# Patient Record
Sex: Male | Born: 1990 | Race: Black or African American | Hispanic: No | Marital: Single
Health system: Southern US, Community
[De-identification: ages and names within clinical notes are randomized; demographics above are authoritative.]

---
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
## 2011-09-17 ENCOUNTER — Encounter (HOSPITAL_COMMUNITY): Payer: Self-pay | Admitting: *Deleted

## 2011-09-17 ENCOUNTER — Emergency Department (HOSPITAL_COMMUNITY)
Admission: EM | Admit: 2011-09-17 | Discharge: 2011-09-17 | Disposition: A | Discharge status: Home / Self Care | Payer: Self-pay | Attending: Emergency Medicine | Admitting: Emergency Medicine

## 2011-09-17 DIAGNOSIS — K922 Gastrointestinal hemorrhage, unspecified: Secondary | ICD-10-CM | POA: Insufficient documentation

## 2011-09-17 DIAGNOSIS — K921 Melena: Secondary | ICD-10-CM | POA: Insufficient documentation

## 2011-09-17 DIAGNOSIS — R1011 Right upper quadrant pain: Secondary | ICD-10-CM | POA: Insufficient documentation

## 2011-09-17 LAB — OCCULT BLOOD, POC DEVICE: Fecal Occult Bld: POSITIVE

## 2011-09-17 MED ORDER — OMEPRAZOLE 20 MG PO CPDR: DELAYED_RELEASE_CAPSULE | ORAL | Status: DC

## 2011-09-17 MED ORDER — SUCRALFATE 1 G PO TABS: ORAL_TABLET | ORAL | Status: DC

## 2011-09-17 NOTE — ED Notes (Signed)
Pt in CDU 3 with mother

## 2011-09-17 NOTE — ED Notes (Signed)
Pt is here with rectal bleeding with bowel movement and has had some lower abd cramping.  No hx of hemorrhoids

## 2011-09-17 NOTE — ED Provider Notes (Signed)
History     CSN: [id number]  Arrival date & time 09/17/11  1048   First MD Initiated Contact with Patient 09/17/11 1308      Chief Complaint  Patient presents with  . Rectal Bleeding    (Consider location/radiation/quality/duration/timing/severity/associated sxs/prior treatment) HPI Comments: Patient with history of several bowel movements with blood over the past 2 weeks. Patient had an episode this morning when he noticed blood on the toilet paper and on the stool. Patient states the blood was dark red. He also describes having right upper quadrant pain for approximately 30 minutes after having a bowel movement. This is resolved. Patient denies nausea or vomiting. He denies diarrhea. Patient does not have any history of colitis or other GI illness.  Patient is a 21 y.o. male presenting with hematochezia. The history is provided by the patient and a relative.  Rectal Bleeding  The current episode started more than 1 week ago. The stool is described as streaked with blood. Associated symptoms include abdominal pain. Pertinent negatives include no fever, no diarrhea, no nausea, no vomiting, no chest pain, no headaches, no coughing and no rash.    History reviewed. No pertinent past medical history.  History reviewed. No pertinent past surgical history.  No family history on file.  History  Substance Use Topics  . Smoking status: Passive Smoker  . Smokeless tobacco: Not on file  . Alcohol Use: Yes     occ      Review of Systems  Constitutional: Negative for fever.  HENT: Negative for sore throat and rhinorrhea.   Eyes: Negative for redness.  Respiratory: Negative for cough.   Cardiovascular: Negative for chest pain.  Gastrointestinal: Positive for abdominal pain, blood in stool and hematochezia. Negative for nausea, vomiting and diarrhea.  Genitourinary: Negative for dysuria.  Musculoskeletal: Negative for myalgias.  Skin: Negative for rash.  Neurological: Negative for  headaches.    Allergies  Review of patient's allergies indicates no known allergies.  Home Medications  No current outpatient prescriptions on file.  BP 106/65  Pulse 72  Temp(Src) 97.7 F (36.5 C) (Oral)  Resp 14  SpO2 98%  Physical Exam  Nursing note and vitals reviewed. Constitutional: He is oriented to person, place, and time. He appears well-developed and well-nourished.  HENT:  Head: Normocephalic and atraumatic.  Eyes: Conjunctivae are normal. Right eye exhibits no discharge. Left eye exhibits no discharge.  Neck: Normal range of motion. Neck supple.  Cardiovascular: Normal rate, regular rhythm and normal heart sounds.   Pulmonary/Chest: Effort normal and breath sounds normal.  Abdominal: Soft. Bowel sounds are normal. There is no tenderness.  Genitourinary: Rectal exam shows no external hemorrhoid, no internal hemorrhoid, no mass, no tenderness and anal tone normal. Guaiac positive stool.  Neurological: He is alert and oriented to person, place, and time.  Skin: Skin is warm and dry.  Psychiatric: He has a normal mood and affect.    ED Course  Procedures (including critical care time)  Labs Reviewed - No data to display No results found.   1. GI bleed     1:30 PM Patient seen and examined.    Vital signs reviewed and are as follows: Filed Vitals:   09/17/11 1124  BP: 106/65  Pulse: 72  Temp: 97.7 F (36.5 C)  Resp: 14    Will move to CDU for rectal exam.  Rectal exam was performed. Guaiac +. Pt was d/w Dr. Radford Pax. Will d/c to home with GI follow-up. Will start on  trial of PPI and carafate. Patient and mother urged to return with worsening pain, worsening bleeding, persistent vomiting, other concerns. Patient and mother verbalize understanding and agree with plan.  MDM  Patient with confirmed blood in stools. Bleeding is minimal. Rectal is otherwise normal. No obvious external or internal hemorrhoids. Patient does not appear anemic and appears well.  Do not suspect colitis. Patient is stable and bleeding is not life threatening -- GI follow-up will be needed and GI referral given. Will give empiric trial of prilosec in case component of PUD given pain.         Eustace Moore [place], [place] 09/17/11 2110

## 2011-09-18 NOTE — ED Provider Notes (Signed)
Medical screening examination/treatment/procedure(s) were performed by non-physician practitioner and as supervising physician I was immediately available for consultation/collaboration.    Nelia Shi, MD 09/18/11 2041

## 2013-03-14 ENCOUNTER — Emergency Department (HOSPITAL_COMMUNITY)
Admission: EM | Admit: 2013-03-14 | Discharge: 2013-03-14 | Disposition: A | Discharge status: Home / Self Care | Payer: Self-pay | Attending: Emergency Medicine | Admitting: Emergency Medicine

## 2013-03-14 DIAGNOSIS — Z79899 Other long term (current) drug therapy: Secondary | ICD-10-CM | POA: Insufficient documentation

## 2013-03-14 DIAGNOSIS — K0889 Other specified disorders of teeth and supporting structures: Secondary | ICD-10-CM

## 2013-03-14 DIAGNOSIS — K089 Disorder of teeth and supporting structures, unspecified: Secondary | ICD-10-CM | POA: Insufficient documentation

## 2013-03-14 MED ORDER — AMOXICILLIN 500 MG PO CAPS: 500.0000 mg | ORAL_CAPSULE | Freq: Three times a day (TID) | ORAL | Status: DC

## 2013-03-14 MED ORDER — OXYCODONE-ACETAMINOPHEN 5-325 MG PO TABS
2.0000 | ORAL_TABLET | Freq: Once | ORAL | Status: AC
  Administered 2013-03-14: 2 via ORAL
  Filled 2013-03-14: qty 2

## 2013-03-14 MED ORDER — OXYCODONE-ACETAMINOPHEN 5-325 MG PO TABS: 1.0000 | ORAL_TABLET | ORAL | Status: DC | PRN

## 2013-03-14 NOTE — ED Provider Notes (Signed)
CSN: [id number]     Arrival date & time 03/14/13  1906 History  This chart was scribed for non-physician practitioner Arthor Captain, PA-C, working with Gilda Crease, by Yevette Edwards, ED Scribe. This patient was seen in room TR08C/TR08C and the patient's care was started at 7:31 PM.   First MD Initiated Contact with Patient 03/14/13 1921     Chief Complaint  Patient presents with  . Dental Pain    HPI HPI Comments: Ronnie Edwards is a 22 y.o. male who presents to the Emergency Department complaining of dental pain to the upper right rear. He describes the pain as "throbbing" and a 9/10.  The pain radiates to his jaw. He states that cold air, talking, and eating increase the pain. The pt denies experiencing any pain with swallowing or any drainage from the site. The pt states he has treated the pain with IBU, but with little resolution.He does not have a dentist.  No past medical history on file. No past surgical history on file. No family history on file. History  Substance Use Topics  . Smoking status: Passive Smoke Exposure - Never Smoker  . Smokeless tobacco: Not on file  . Alcohol Use: Yes     Comment: occ    Review of Systems  Constitutional: Positive for appetite change.  HENT: Positive for dental problem. Negative for trouble swallowing.   Respiratory: Negative for shortness of breath.     Allergies  Review of patient's allergies indicates no known allergies.  Home Medications   Current Outpatient Rx  Name  Route  Sig  Dispense  Refill  . omeprazole (PRILOSEC) 20 MG capsule      Take one tab PO bid x 3 days then once a day   30 capsule   0   . sucralfate (CARAFATE) 1 G tablet      Take with meals and at bedtime   30 tablet   0    There were no vitals taken for this visit. Physical Exam  Nursing note and vitals reviewed. Constitutional: He is oriented to person, place, and time. He appears well-developed and well-nourished. No distress.  HENT:   Head: Normocephalic and atraumatic.  Fracture on back of last molar with a carry in teh center. Some erythema in tissue. No induration, no area of fluctuance, no edema.  Uvula midline. Airway patent. Tender to palpation of masseter. Some tonsillar lymphadenopathy on the right side.   Eyes: EOM are normal.  Neck: Neck supple. No tracheal deviation present.  Cardiovascular: Normal rate.   Pulmonary/Chest: Effort normal. No respiratory distress.  Musculoskeletal: Normal range of motion.  Neurological: He is alert and oriented to person, place, and time.  Skin: Skin is warm and dry.  Psychiatric: He has a normal mood and affect. His behavior is normal.    ED Course    COORDINATION OF CARE:  7:36 PM- Discussed treatment plan with patient, and the patient agreed to the plan.   Procedures (including critical care time)  Labs Reviewed - No data to display No results found. 1. Pain, dental     MDM  Patient with toothache.  No gross abscess.  Exam unconcerning for Ludwig's angina or spread of infection.  Will treat with penicillin and pain medicine.  Urged patient to follow-up with dentist.    I personally performed the services described in this documentation, which was scribed in my presence. The recorded information has been reviewed and is accurate.     Cammy Copa  Tiburcio Pea, PA-C 03/15/13 1934

## 2013-03-14 NOTE — ED Notes (Signed)
Pt c/o dental pain upper right rear x 2 months

## 2013-03-16 NOTE — ED Provider Notes (Signed)
Medical screening examination/treatment/procedure(s) were performed by non-physician practitioner and as supervising physician I was immediately available for consultation/collaboration.    Errika Narvaiz J. Rayaan Lorah, MD 03/16/13 0722 

## 2013-04-03 ENCOUNTER — Emergency Department (HOSPITAL_COMMUNITY): Payer: Self-pay

## 2013-04-03 ENCOUNTER — Encounter (HOSPITAL_COMMUNITY): Payer: Self-pay | Admitting: Emergency Medicine

## 2013-04-03 ENCOUNTER — Emergency Department (HOSPITAL_COMMUNITY)
Admission: EM | Admit: 2013-04-03 | Discharge: 2013-04-03 | Disposition: A | Discharge status: Home / Self Care | Payer: Self-pay | Attending: Emergency Medicine | Admitting: Emergency Medicine

## 2013-04-03 DIAGNOSIS — S0003XA Contusion of scalp, initial encounter: Secondary | ICD-10-CM | POA: Insufficient documentation

## 2013-04-03 DIAGNOSIS — S0083XA Contusion of other part of head, initial encounter: Secondary | ICD-10-CM

## 2013-04-03 DIAGNOSIS — K029 Dental caries, unspecified: Secondary | ICD-10-CM | POA: Insufficient documentation

## 2013-04-03 MED ORDER — NAPROXEN 500 MG PO TABS: 500.0000 mg | ORAL_TABLET | Freq: Two times a day (BID) | ORAL | Status: DC
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
## 2013-04-03 NOTE — ED Provider Notes (Signed)
Medical screening examination/treatment/procedure(s) were performed by non-physician practitioner and as supervising physician I was immediately available for consultation/collaboration.   William Arianna Haydon, MD 04/03/13 1554 

## 2013-04-03 NOTE — ED Notes (Signed)
Patient transported to CT 
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
## 2013-04-03 NOTE — Progress Notes (Signed)
Met Patient at bedside.Role of case manager explained.Patient verbalizes his understanding.Patient reports he does not have have a PCP. Education provided to patient on the [facility] on [employer].Patient provided with resource sheet and receptive to information.

## 2013-04-09 ENCOUNTER — Telehealth: Payer: Self-pay | Admitting: General Practice

## 2013-05-03 NOTE — Telephone Encounter (Signed)
Pt did not return call to schedule appt

## 2014-03-15 ENCOUNTER — Encounter (HOSPITAL_COMMUNITY): Payer: Self-pay | Admitting: Emergency Medicine

## 2014-03-15 ENCOUNTER — Emergency Department (HOSPITAL_COMMUNITY)
Admission: EM | Admit: 2014-03-15 | Discharge: 2014-03-15 | Disposition: A | Discharge status: Home / Self Care | Payer: Self-pay | Attending: Emergency Medicine | Admitting: Emergency Medicine

## 2014-03-15 DIAGNOSIS — Z79899 Other long term (current) drug therapy: Secondary | ICD-10-CM | POA: Insufficient documentation

## 2014-03-15 DIAGNOSIS — Z791 Long term (current) use of non-steroidal anti-inflammatories (NSAID): Secondary | ICD-10-CM | POA: Insufficient documentation

## 2014-03-15 DIAGNOSIS — K089 Disorder of teeth and supporting structures, unspecified: Secondary | ICD-10-CM | POA: Insufficient documentation

## 2014-03-15 DIAGNOSIS — K029 Dental caries, unspecified: Secondary | ICD-10-CM | POA: Insufficient documentation

## 2014-03-15 MED ORDER — TRAMADOL HCL 50 MG PO TABS: 50.0000 mg | ORAL_TABLET | Freq: Four times a day (QID) | ORAL | Status: DC

## 2014-03-15 MED ORDER — TRAMADOL HCL 50 MG PO TABS
50.0000 mg | ORAL_TABLET | Freq: Once | ORAL | Status: AC
  Administered 2014-03-15: 50 mg via ORAL
  Filled 2014-03-15: qty 1

## 2014-03-15 NOTE — ED Provider Notes (Signed)
Medical screening examination/treatment/procedure(s) were performed by non-physician practitioner and as supervising physician I was immediately available for consultation/collaboration.    Collie Wernick, MD 03/15/14 0759 

## 2014-03-15 NOTE — ED Provider Notes (Signed)
CSN: [id number]     Arrival date & time 03/15/14  0120 History   First MD Initiated Contact with Patient 03/15/14 [phone]     Chief Complaint  Patient presents with  . Dental Pain     (Consider location/radiation/quality/duration/timing/severity/associated sxs/prior Treatment) Patient is a 23 y.o. male presenting with tooth pain. The history is provided by the patient.  Dental Pain Location:  Upper Upper teeth location:  16/LU 3rd molar Quality:  Dull Onset quality:  Gradual Timing:  Constant Progression:  Worsening Chronicity:  Chronic Context: dental caries   Context: not abscess   Relieved by:  Nothing Worsened by:  Cold food/drink Ineffective treatments:  NSAIDs Associated symptoms: no facial pain, no facial swelling, no fever and no headaches     History reviewed. No pertinent past medical history. History reviewed. No pertinent past surgical history. No family history on file. History  Substance Use Topics  . Smoking status: Passive Smoke Exposure - Never Smoker  . Smokeless tobacco: Not on file  . Alcohol Use: Yes     Comment: occ    Review of Systems  Constitutional: Negative for fever.  HENT: Positive for dental problem. Negative for facial swelling.   Neurological: Negative for dizziness and headaches.  All other systems reviewed and are negative.     Allergies  Coconut flavor  Home Medications   Prior to Admission medications   Medication Sig Start Date End Date Taking? Authorizing Provider  ibuprofen (ADVIL,MOTRIN) 800 MG tablet Take 800 mg by mouth every 8 (eight) hours as needed for moderate pain.   Yes Historical Provider, MD  traMADol (ULTRAM) 50 MG tablet Take 1 tablet (50 mg total) by mouth 4 (four) times daily. 03/15/14   Arman FilterGail K Denym Christenberry, NP   BP 124/66  Pulse 91  Temp(Src) 98.4 F (36.9 C) (Oral)  Resp 14  Ht 6' (1.829 m)  Wt 200 lb (90.719 kg)  BMI 27.12 kg/m2  SpO2 100% Physical Exam  Nursing note and vitals reviewed. Constitutional:  He appears well-developed and well-nourished.  HENT:  Head: Normocephalic.  Right Ear: External ear normal.  Left Ear: External ear normal.  Mouth/Throat:    Eyes: Pupils are equal, round, and reactive to light.  Neck: Normal range of motion.  Cardiovascular: Normal rate.   Pulmonary/Chest: Effort normal.  Musculoskeletal: Normal range of motion.  Lymphadenopathy:    He has no cervical adenopathy.  Skin: Skin is warm.    ED Course  Procedures (including critical care time) Labs Review Labs Reviewed - No data to display  Imaging Review No results found.   EKG Interpretation None      MDM   Final diagnoses:  Dental caries         Arman FilterGail K Altair Stanko, NP 03/15/14 [phone]

## 2014-03-15 NOTE — Discharge Instructions (Signed)
You have been referred to a dentist call first thing in the morning telling the receptionist that you have been referred through the ED

## 2014-03-15 NOTE — ED Notes (Signed)
Pt. reports chronic left upper molar pain for several months worse these past several days unrelieved by OTC pain medications .

## 2014-04-15 ENCOUNTER — Encounter (HOSPITAL_COMMUNITY): Payer: Self-pay | Admitting: Emergency Medicine

## 2014-04-15 ENCOUNTER — Emergency Department (HOSPITAL_COMMUNITY)
Admission: EM | Admit: 2014-04-15 | Discharge: 2014-04-15 | Disposition: A | Discharge status: Home / Self Care | Payer: Self-pay | Attending: Emergency Medicine | Admitting: Emergency Medicine

## 2014-04-15 DIAGNOSIS — K0889 Other specified disorders of teeth and supporting structures: Secondary | ICD-10-CM

## 2014-04-15 DIAGNOSIS — Z79899 Other long term (current) drug therapy: Secondary | ICD-10-CM | POA: Insufficient documentation

## 2014-04-15 DIAGNOSIS — K089 Disorder of teeth and supporting structures, unspecified: Secondary | ICD-10-CM | POA: Insufficient documentation

## 2014-04-15 DIAGNOSIS — K029 Dental caries, unspecified: Secondary | ICD-10-CM | POA: Insufficient documentation

## 2014-04-15 DIAGNOSIS — R51 Headache: Secondary | ICD-10-CM | POA: Insufficient documentation

## 2014-04-15 MED ORDER — PENICILLIN V POTASSIUM 500 MG PO TABS: 500.0000 mg | ORAL_TABLET | Freq: Four times a day (QID) | ORAL | Status: AC

## 2014-04-15 MED ORDER — TRAMADOL HCL 50 MG PO TABS: 50.0000 mg | ORAL_TABLET | Freq: Four times a day (QID) | ORAL | Status: DC | PRN

## 2014-04-15 NOTE — ED Notes (Signed)
PT ambulated with baseline gait; VSS; A&Ox3; no signs of distress; respirations even and unlabored; skin warm and dry; no questions upon discharge.  

## 2014-04-15 NOTE — ED Notes (Signed)
PA at bedside.

## 2014-04-15 NOTE — Discharge Instructions (Signed)
Please call your doctor for a followup appointment within 24-48 hours. When you talk to your doctor please let them know that you were seen in the emergency department and have them acquire all of your records so that they can discuss the findings with you and formulate a treatment plan to fully care for your new and ongoing problems. Please call and set up an appointment with your primary care provider Please call and set up an appointment with dentist Please rest and stay hydrated Please take medications as prescribed and on a full stomach Please take medications as prescribed - while on pain medications there is to be no drinking alcohol, driving, operating any heavy machinery. If extra please dispose in a proper manner. Please do not take any extra Tylenol with this medication for this can lead to Tylenol overdose and liver issues.  Please continue to monitor symptoms closely and if symptoms are to worsen or change (fever greater than 101, chills, sweating, nausea, vomiting, chest pain, shortness of breathe, difficulty breathing, weakness, numbness, tingling, worsening or changes to pain pattern, facial swelling, inability to open and close the jaw line, difficulty swallowing, swelling to the throat, bleeding or drainage from the mouth) please report back to the Emergency Department immediately.    Dental Pain A tooth ache may be caused by cavities (tooth decay). Cavities expose the nerve of the tooth to air and hot or cold temperatures. It may come from an infection or abscess (also called a boil or furuncle) around your tooth. It is also often caused by dental caries (tooth decay). This causes the pain you are having. DIAGNOSIS  Your caregiver can diagnose this problem by exam. TREATMENT   If caused by an infection, it may be treated with medications which kill germs (antibiotics) and pain medications as prescribed by your caregiver. Take medications as directed.  Only take over-the-counter or  prescription medicines for pain, discomfort, or fever as directed by your caregiver.  Whether the tooth ache today is caused by infection or dental disease, you should see your dentist as soon as possible for further care. SEEK MEDICAL CARE IF: The exam and treatment you received today has been provided on an emergency basis only. This is not a substitute for complete medical or dental care. If your problem worsens or new problems (symptoms) appear, and you are unable to meet with your dentist, call or return to this location. SEEK IMMEDIATE MEDICAL CARE IF:   You have a fever.  You develop redness and swelling of your face, jaw, or neck.  You are unable to open your mouth.  You have severe pain uncontrolled by pain medicine. MAKE SURE YOU:   Understand these instructions.  Will watch your condition.  Will get help right away if you are not doing well or get worse. Document Released: 07/29/2005 Document Revised: 10/21/2011 Document Reviewed: 03/16/2008 [facility] Patient Information 2015 [place], [place]. This information is not intended to replace advice given to you by your health care provider. Make sure you discuss any questions you have with your health care provider.  Dental Caries Dental caries is tooth decay. This decay can cause a hole in teeth (cavity) that can get bigger and deeper over time. HOME CARE  Brush and floss your teeth. Do this at least two times a day.  Use a fluoride toothpaste.  Use a mouth rinse if told by your dentist or doctor.  Eat less sugary and starchy foods. Drink less sugary drinks.  Avoid snacking often  on sugary and starchy foods. Avoid sipping often on sugary drinks.  Keep regular checkups and cleanings with your dentist.  Use fluoride supplements if told by your dentist or doctor.  Allow fluoride to be applied to teeth if told by your dentist or doctor. Document Released: 05/07/2008 Document Revised: 12/13/2013 Document Reviewed:  07/31/2012 [facility] Patient Information 2015 [place], [place]. This information is not intended to replace advice given to you by your health care provider. Make sure you discuss any questions you have with your health care provider.   Emergency Department Resource Guide 1) Find a Doctor and Pay Out of Pocket Although you won't have to find out who is covered by your insurance plan, it is a good idea to ask around and get recommendations. You will then need to call the office and see if the doctor you have chosen will accept you as a new patient and what types of options they offer for patients who are self-pay. Some doctors offer discounts or will set up payment plans for their patients who do not have insurance, but you will need to ask so you aren't surprised when you get to your appointment.  2) Contact Your Local Health Department Not all health departments have doctors that can see patients for sick visits, but many do, so it is worth a call to see if yours does. If you don't know where your local health department is, you can check in your phone book. The CDC also has a tool to help you locate your state's health department, and many state websites also have listings of all of their local health departments.  3) Find a Walk-in Clinic If your illness is not likely to be very severe or complicated, you may want to try a walk in clinic. These are popping up all over the country in pharmacies, drugstores, and shopping centers. They're usually staffed by nurse practitioners or physician assistants that have been trained to treat common illnesses and complaints. They're usually fairly quick and inexpensive. However, if you have serious medical issues or chronic medical problems, these are probably not your best option.  No Primary Care Doctor: - Call Health Connect at  [phone] - they can help you locate a primary care doctor that  accepts your insurance, provides certain services, etc. - Physician  Referral Service- [phone]  Chronic Pain Problems: Organization         Address  Phone   Notes  [facility]  [phone] Patients need to be referred by their primary care doctor.   Medication Assistance: Organization         Address  Phone   Notes  [facility] [address] [place] [address] [place], [place] [address] [phone] --Must be a resident of [facility] -- Must have NO insurance coverage whatsoever (no Medicaid/ Medicare, etc.) -- The pt. MUST have a primary care doctor that directs their care regularly and follows them in the community   MedAssist  [phone]   [employer]  [phone]    Agencies that provide inexpensive medical care: Organization         Address  Phone   Notes  [facility]  [phone]   [facility]    [phone]   [facility] [address] [place], [place] [address] [phone]   [facility] [address] [place] [place] [phone]   Planned Parenthood    (  [phone]) [phone]   [facility]    [phone]) [phone]   [facility]  [address], [place] Phone:  [phone]) [phone], Fax:  [phone] Hours of Operation:  9 am - 6 pm, M-F.  Also accepts Medicaid/Medicare and self-pay.  [facility]  [address], [place] Phone: [phone](336) [phone], Fax: [phone](336) [phone]. Hours of Operation:  8:30 am - 5:30 pm, M-F.  Also accepts Medicaid and self-pay.  [facility] [place] [address], [place] Phone: [phone](336) [phone]   [facility] [address], [place], [place] [phone], Ext. [phone] Mondays & Thursdays: 7-9 AM.  First 15 patients are seen on a first come, first serve basis.    Medicaid-accepting [facility] Providers:  Organization         Address  Phone   Notes  [facility] [address], [place] [phone] Also accepts self-pay patients.  [facility] [address], [place]  [phone]) [phone]   [facility] [address], [place] [phone](336) [phone]   [facility] [address] [place] [address], [place] [phone](336) [phone]   Renaye RakersVeita Bland [address], [place]   [phone] Only accepts [place] Access [place] patients after they have their name applied to their card.   Self-Pay (no insurance) in [facility]:  Organization         Address  Phone   Notes  Sickle Cell Patients, [facility] [address], [place] [phone]   [facility] [address], [place] [phone]   [facility]  [address], [place] [phone](336) [phone]   [facility]/Dr. Osei-Bonsu  [address] [place] [address], [place] or [phone], [place] [phone]) [phone] Phone number for both [place] and [place] locations is the same.  [facility] [address], [place] [phone]) [phone]   [facility] [place] [address] [place] [address], [place] or [address] [phone]) [phone]) [phone]   [facility] [address], [place] [phone](336) [phone], phone; [phone]) [phone], fax Sees patients 1st and 3rd Saturday of every month.  Must not qualify for public or private insurance (i.e. Medicaid, Medicare, [place] Health Choice, Veterans' Benefits)  Household income should be no more than 200% of the poverty level The clinic cannot treat you if you are pregnant or think you are pregnant  Sexually transmitted diseases are not treated at the clinic.    Dental Care: Organization         Address  Phone  Notes  [facility] of [facility] [address], [place] [phone]) [phone] Accepts children up to age 25 who are enrolled  in [place]icaid or [place] Health Choice; pregnant women with a Medicaid card; and children who have applied for Medicaid or [place] Health Choice, but were declined, whose parents can pay a reduced fee at time of service.  [facility] of [facility] [place]  [address], [place] [phone]) [phone] Accepts children up to age 10221 who are enrolled in [place] or [place] Health Choice; pregnant women with a Medicaid card; and children who have applied for Medicaid or [place] Health Choice, but were declined, whose parents can pay a  reduced fee at time of service.  [facility]  [address] [place], [place] [phone] Patients are seen by appointment only. Walk-ins are not accepted. [facility] will see patients 46 years of age and older. Monday - Tuesday (8am-5pm) Most Wednesdays (8:30-5pm) $30 per visit, cash only  [facility] Adult Dental Access PROGRAM  [address], [facility] [phone] Patients are seen by appointment only. Walk-ins are not accepted. [facility] will see patients 23 years of age and older. One Wednesday Evening (Monthly: Volunteer Based).  $30 per visit, cash only  [employer]  [phone] for adults; Children under age 7, call Graduate Pediatric Dentistry at [phone]. Children aged 32-14, please call [phone] to request a pediatric application.  Dental services are provided in all areas of dental care including fillings, crowns and bridges, complete and partial dentures, implants, gum treatment, root canals, and extractions. Preventive care is also provided. Treatment is provided to both adults and children. Patients are selected via a lottery and there is often a waiting list.   [facility] [address], [place]  [phone] [email or web address]   [facility] [address] [place], [place] [phone], Ext. [phone] Second and Fourth Thursday of each month, opens at  6:30 AM; Clinic ends at 9 AM.  Patients are seen on a first-come first-served basis, and a limited number are seen during each clinic.   [facility]  [address] [place] [place], [place] [phone]   Eligibility Requirements You must have lived in [place], [place], or [place] counties for at least the last three months.   You cannot be eligible for state or federal sponsored [place], including [employer], [place], or [employer].   You generally cannot be eligible for healthcare insurance through your employer.    How to apply: Eligibility screenings are held every Tuesday and Wednesday afternoon from 1:00 pm until 4:00 pm. You do not need an appointment for the interview!  [facility] [address], [place], [place] [phone]   [facility] Health Department  [phone]   [facility] Health Department  [phone]   [facility] Health Department  [phone]    Behavioral Health Resources in the Community: Intensive Outpatient Programs Organization         Address  Phone  Notes  [facility] [address] [place], [place] [phone]   [facility] Outpatient [address], [place], [place] [phone]   [facility] [address], [place], [place]  [phone]   [facility] [address],  [place], [place] [phone] or [phone]   Substance Abuse Resources Organization         Address  Phone  Notes  [facility]  [phone]   [facility]  [phone]   [facility]  [phone]   Floydene Flock  [phone]   Residential & Outpatient Substance Abuse Program  [phone]   Psychological Services Organization         Address  Phone  Notes  [facility]  [phone]   [facility]  [phone]   [facility] [address], [place]  [phone] or [phone]    Mobile Crisis Teams Organization         Address  Phone  Notes  [facility], Mobile Crisis Care Unit  [phone]  [facility]  [address] [place], [place] [phone]   [facility] [address] [place] [phone]    Self-Help/Support Groups Organization         Address  Phone             Notes  [facility] [place] - variety of support groups  336- [phone] Call for more information  Narcotics Anonymous (NA), [facility] [address], [place]  2 meetings at this location   Statisticianesidential Treatment Programs Organization         Address  Phone  Notes  [facility] [address],    [place]  [phone]   [facility]  [address], [place] [phone], [place], [place] [phone]   [facility] [address], [place] [phone] Admissions: 8am-3pm M-F  [facility] [address]    [place], [place] [phone]   [facility] [address], [place], [place] [phone]   [facility] [address]  [place], [place] [phone]   [facility] - Intensive Outpatient [address], [place], [place] [phone]   [facility] [address]  [place], [place] [phone] or [phone]   [facility] ([facility]) [address] [place], [place] [phone] Accepts Medicaid  [facility] [address]  [place] [phone] Substance Abuse/Addiction Treatment   [facility] Behavioral Health Resources Organization         Address  Phone  Notes  [facility]  [phone]) [phone]   Angie FavaJulie Brannon, PhD [address], [address] [place], [place]   [phone]) [phone] or [phone](336) [phone]   [facility]   [address] [place], [place] [phone](336) [phone]   [facility]  [address], [place], [place] [phone](336) [phone] Insurance/Medicaid/sponsorship through [facility]  [facility] Families [address]                                    [place], [place] [phone](336) [phone] Therapy/tele-psych/case  [facility] [address]   [place], [place] [phone]) [phone]    Dr. Lolly MustacheArfeen  [phone](336) [phone]   [facility]  [employer] [facility] 1) [address], [place] 2) [address], [place] 3)  [address], [place] [phone]) [phone](336) [phone]  [phone](336) [phone]   [facility] [place] [place] Child Abuse Hotline [phone](336) [phone] or [phone](336) [phone] (After Hours)

## 2014-04-15 NOTE — ED Notes (Signed)
Toothache for one month.  He has been seen here for the same.  He was working when the free dentists came to town  And he did not make it to be seen

## 2014-04-15 NOTE — ED Provider Notes (Signed)
CSN: [id number]     Arrival date & time 04/15/14  1502 History  This chart was scribed for Raymon Mutton, PA-C working with Ethelda Chick, MD by Evon Slack, ED Scribe. This patient was seen in room TR09C/TR09C and the patient's care was started at 4:14 PM.      Chief Complaint  Patient presents with  . Dental Problem   The history is provided by the patient. No language interpreter was used.   HPI Comments: Ronnie Edwards is a 23 y.o. male who presents to the Emergency Department complaining of sharp shooting left upper dental pain onset 2 months prior. He states that he has associated headache and intermittent facial swelling. He states that the pain recently worsened over the past few days. He states that he believes there is a dental cavity present. He states he has tried taken ibuprofen with no relief.  He states that has been in the ED before for similar symptoms - stated that he was unable to get to the free dental clinic a month ago. Denies visual disturbance, difficulty swallowing, chest pain, SOB, fever, chills, ear pain or drainage from the tooth, throat closing sensation.  He states that he is a current everyday smoker.    History reviewed. No pertinent past medical history. History reviewed. No pertinent past surgical history. No family history on file. History  Substance Use Topics  . Smoking status: Passive Smoke Exposure - Never Smoker  . Smokeless tobacco: Not on file  . Alcohol Use: Yes     Comment: occ    Review of Systems  Constitutional: Negative for fever.  HENT: Positive for dental problem. Negative for ear pain and trouble swallowing.   Eyes: Negative for visual disturbance.  Respiratory: Negative for shortness of breath.   Cardiovascular: Negative for chest pain.  Neurological: Positive for headaches.    Allergies  Coconut flavor  Home Medications   Prior to Admission medications   Medication Sig Start Date End Date Taking? Authorizing Provider   ibuprofen (ADVIL,MOTRIN) 800 MG tablet Take 800 mg by mouth every 8 (eight) hours as needed for moderate pain.    Historical Provider, MD  penicillin v potassium (VEETID) 500 MG tablet Take 1 tablet (500 mg total) by mouth 4 (four) times daily. 04/15/14 04/22/14  Xiomara Sevillano, PA-C  traMADol (ULTRAM) 50 MG tablet Take 1 tablet (50 mg total) by mouth 4 (four) times daily. 03/15/14   Arman Filter, NP  traMADol (ULTRAM) 50 MG tablet Take 1 tablet (50 mg total) by mouth every 6 (six) hours as needed. 04/15/14   Dionicio Shelnutt, PA-C   Triage Vitals: BP 120/75  Pulse 71  Temp(Src) 98.2 F (36.8 C)  Resp 16  Ht 6' (1.829 m)  Wt 206 lb (93.441 kg)  BMI 27.93 kg/m2  SpO2 99%  Physical Exam  Nursing note and vitals reviewed. Constitutional: He is oriented to person, place, and time. He appears well-developed and well-nourished. No distress.  HENT:  Head: Normocephalic and atraumatic.  Mouth/Throat: Oropharynx is clear and moist. No oropharyngeal exudate.    Mild facial swelling localized to the left upper jawline with negative erythema or warmth upon palpation Negative trismus Uvula midline with symmetrical elevation. Negative uvula deviation. Negative uvula swelling. Negative sublingual lesions.  Chipped second premolar of the left maxillary jawline identified with mild erythema identified to the gumline. Negative active drainage or bleeding noted. Negative drainable abscess identified at this time. Negative trismus.    Eyes: Conjunctivae and EOM  are normal. Pupils are equal, round, and reactive to light. Right eye exhibits no discharge. Left eye exhibits no discharge.  Neck: Normal range of motion. Neck supple. No tracheal deviation present.  Negative neck stiffness Negative nuchal rigidity Negative cervical lymphadenopathy Negative meningeal signs  Cardiovascular: Normal rate, regular rhythm and normal heart sounds.  Exam reveals no friction rub.   No murmur heard. Cap refill less than  3 seconds  Pulmonary/Chest: Effort normal and breath sounds normal. No respiratory distress. He has no wheezes. He has no rales.  Patient is able to speak in full sentences without difficulty Negative use of accessory muscles Negative stridor  Musculoskeletal: Normal range of motion.  Full ROM to upper and lower extremities without difficulty noted, negative ataxia noted.  Lymphadenopathy:    He has no cervical adenopathy.  Neurological: He is alert and oriented to person, place, and time. No cranial nerve deficit. He exhibits normal muscle tone. Coordination normal.  Cranial nerves III-XII grossly intact Strength 5+/5+ to upper extremities bilaterally with resistance applied, equal distribution noted Equal grip strength bilaterally Negative facial drooping Negative slurred speech Negative aphasia  Skin: Skin is warm and dry. No rash noted. He is not diaphoretic. No erythema.  Psychiatric: He has a normal mood and affect. His behavior is normal.    ED Course  Procedures (including critical care time) DIAGNOSTIC STUDIES: Oxygen Saturation is 99% on RA, normal by my interpretation.    COORDINATION OF CARE: 4:46 PM-Discussed treatment plan which includes pain medication and antibiotics with pt at bedside and pt agreed to plan.     Labs Review Labs Reviewed - No data to display  Imaging Review No results found.   EKG Interpretation None      MDM   Final diagnoses:  Dental caries  Pain, dental   Medications - No data to display  Filed Vitals:   04/15/14 1555 04/15/14 1801  BP: 120/75 128/79  Pulse: 71 70  Temp: 98.2 F (36.8 C)   Resp: 16 18  Height: 6' (1.829 m)   Weight: 206 lb (93.441 kg)   SpO2: 99% 100%   I personally performed the services described in this documentation, which was scribed in my presence. The recorded information has been reviewed and is accurate.  Negative trismus. No sign of drainable abscess at this time. Doubt peritonsillar abscess.  Doubt retropharyngeal abscess. Doubt Ludwig's angina. Suspicion to be dental pain secondary to poor dentition. Patient stable, afebrile. Discharged patient. Discharged patient with pain medications-discussed course, cautions, disposal technique. Discussed with patient to apply cool compressions. Discussed with patient to rest and stay hydrated. Referred to dentist. Discussed with patient to closely monitor symptoms and if symptoms are to worsen or change to report back to the ED - strict return instructions given.  Patient agreed to plan of care, understood, all questions answered.   Raymon Mutton, PA-C 04/16/14 [phone]

## 2014-04-15 NOTE — ED Notes (Signed)
Pocket of swelling noted to inner cheek, gumline on left side of mouth. Denies fevers.

## 2014-04-16 NOTE — ED Provider Notes (Signed)
Medical screening examination/treatment/procedure(s) were performed by non-physician practitioner and as supervising physician I was immediately available for consultation/collaboration.   EKG Interpretation None       Ethelda Chick, MD 04/16/14 1504

## 2014-05-21 ENCOUNTER — Emergency Department (HOSPITAL_COMMUNITY)
Admission: EM | Admit: 2014-05-21 | Discharge: 2014-05-21 | Disposition: A | Discharge status: Home / Self Care | Payer: Self-pay | Attending: Emergency Medicine | Admitting: Emergency Medicine

## 2014-05-21 ENCOUNTER — Encounter (HOSPITAL_COMMUNITY): Payer: Self-pay | Admitting: Emergency Medicine

## 2014-05-21 DIAGNOSIS — K625 Hemorrhage of anus and rectum: Secondary | ICD-10-CM | POA: Insufficient documentation

## 2014-05-21 DIAGNOSIS — R197 Diarrhea, unspecified: Secondary | ICD-10-CM | POA: Insufficient documentation

## 2014-05-21 LAB — CBC WITH DIFFERENTIAL/PLATELET
BASOS ABS: 0 10*3/uL (ref 0.0–0.1)
Basophils Relative: 0 % (ref 0–1)
EOS ABS: 0.2 10*3/uL (ref 0.0–0.7)
EOS PCT: 3 % (ref 0–5)
HCT: 44.3 % (ref 39.0–52.0)
Hemoglobin: 15.4 g/dL (ref 13.0–17.0)
Lymphocytes Relative: 24 % (ref 12–46)
Lymphs Abs: 1.7 10*3/uL (ref 0.7–4.0)
MCH: 30.3 pg (ref 26.0–34.0)
MCHC: 34.8 g/dL (ref 30.0–36.0)
MCV: 87 fL (ref 78.0–100.0)
Monocytes Absolute: 0.5 10*3/uL (ref 0.1–1.0)
Monocytes Relative: 7 % (ref 3–12)
Neutro Abs: 4.8 10*3/uL (ref 1.7–7.7)
Neutrophils Relative %: 66 % (ref 43–77)
PLATELETS: 228 10*3/uL (ref 150–400)
RBC: 5.09 MIL/uL (ref 4.22–5.81)
RDW: 13.1 % (ref 11.5–15.5)
WBC: 7.3 10*3/uL (ref 4.0–10.5)

## 2014-05-21 LAB — URINALYSIS, ROUTINE W REFLEX MICROSCOPIC
Bilirubin Urine: NEGATIVE
GLUCOSE, UA: NEGATIVE mg/dL
HGB URINE DIPSTICK: NEGATIVE
Ketones, ur: NEGATIVE mg/dL
LEUKOCYTES UA: NEGATIVE
Nitrite: NEGATIVE
Protein, ur: NEGATIVE mg/dL
SPECIFIC GRAVITY, URINE: 1.005 (ref 1.005–1.030)
UROBILINOGEN UA: 0.2 mg/dL (ref 0.0–1.0)
pH: 6 (ref 5.0–8.0)

## 2014-05-21 LAB — COMPREHENSIVE METABOLIC PANEL
ALT: 19 U/L (ref 0–53)
AST: 21 U/L (ref 0–37)
Albumin: 4.2 g/dL (ref 3.5–5.2)
Alkaline Phosphatase: 71 U/L (ref 39–117)
Anion gap: 12 (ref 5–15)
BUN: 13 mg/dL (ref 6–23)
CALCIUM: 9.4 mg/dL (ref 8.4–10.5)
CO2: 28 mEq/L (ref 19–32)
Chloride: 100 mEq/L (ref 96–112)
Creatinine, Ser: 1.22 mg/dL (ref 0.50–1.35)
GFR calc non Af Amer: 81 mL/min — ABNORMAL LOW (ref >90)
Glucose, Bld: 99 mg/dL (ref 70–99)
Potassium: 3.7 mEq/L (ref 3.7–5.3)
SODIUM: 140 meq/L (ref 137–147)
TOTAL PROTEIN: 8.3 g/dL (ref 6.0–8.3)
Total Bilirubin: 0.3 mg/dL (ref 0.3–1.2)

## 2014-05-21 MED ORDER — DIPHENOXYLATE-ATROPINE 2.5-0.025 MG PO TABS: 2.0000 | ORAL_TABLET | Freq: Four times a day (QID) | ORAL | Status: AC | PRN

## 2014-05-21 MED ORDER — PROMETHAZINE HCL 25 MG PO TABS: 25.0000 mg | ORAL_TABLET | Freq: Four times a day (QID) | ORAL | Status: AC | PRN

## 2014-05-21 MED ORDER — HYDROCORTISONE ACETATE 25 MG RE SUPP: 25.0000 mg | Freq: Two times a day (BID) | RECTAL | Status: AC

## 2014-05-21 NOTE — Discharge Instructions (Signed)
[facility] to be further evaluated for your bleeding. If the bleeding worsens, return to the ER.  Bloody Diarrhea Bloody diarrhea can be caused by many different conditions. Most of the time bloody diarrhea is the result of food poisoning or minor infections. Bloody diarrhea usually improves over 2 to 3 days of rest and fluid replacement. Other conditions that can cause bloody diarrhea include:  Internal bleeding.  Infection.  Diseases of the bowel and colon. Internal bleeding from an ulcer or bowel disease can be severe and requires hospital care or even surgery. DIAGNOSIS  To find out what is wrong your caregiver may check your:  Stool.  Blood.  Results from a test that looks inside the body (endoscopy). TREATMENT   Get plenty of rest.  Drink enough water and fluids to keep your urine clear or pale yellow.  Do not smoke.  Solid foods and dairy products should be avoided until your illness improves.  As you improve, slowly return to a regular diet with easily-digested foods first. Examples are:  Bananas.  Rice.  Toast.  Crackers. You should only need these for about 2 days before adding more normal foods to your diet.  Avoid spicy or fatty foods as well as caffeine and alcohol for several days.  Medicine to control cramping and diarrhea can relieve symptoms but may prolong some cases of bloody diarrhea. Antibiotics can speed recovery from diarrhea due to some bacterial infections. Call your caregiver if diarrhea does not get better in 3 days. SEEK MEDICAL CARE IF:   You do not improve after 3 days.  Your diarrhea improves but your stool appears black. SEEK IMMEDIATE MEDICAL CARE IF:   You become extremely weak or faint.  You become very sweaty.  You have increased pain or bleeding.  You develop repeated vomiting.  You vomit and you see blood or the vomit looks black in color.  You have a fever. Document Released:  07/29/2005 Document Revised: 10/21/2011 Document Reviewed: 06/30/2009 [facility] Patient Information 2015 [employer] [place]. This information is not intended to replace advice given to you by your health care provider. Make sure you discuss any questions you have with your health care provider.

## 2014-05-21 NOTE — ED Notes (Addendum)
Pt with snoring respirations while nurse asking questions.  Pt arouses easily to answer questions returns to snoring respiration. Pt reports drinking cup of liquor last night.  Has been drinking green koolaid since onset of diarrhea.   Child had recent vomiting, diarrhea.

## 2014-05-21 NOTE — ED Provider Notes (Signed)
CSN: [id number]     Arrival date & time 05/21/14  1132 History   First MD Initiated Contact with Patient 05/21/14 1416     Chief Complaint  Patient presents with  . Diarrhea  . Blood In Stools     (Consider location/radiation/quality/duration/timing/severity/associated sxs/prior Treatment) HPI Comments: Patient presents to the ER for evaluation of rectal bleeding. Patient reports that he has had diarrhea for the last 2 days. His daughter had a stomach bug which caused diarrhea prior to his onset of symptoms. Patient reports that this morning he had a bowel movement and there was blood in the toilet. He has not been experiencing any abdominal pain. There is no rectal pain or discomfort. She has not had any weakness, dizziness, palpitations or passing out.  Patient is a 23 y.o. male presenting with diarrhea.  Diarrhea   History reviewed. No pertinent past medical history. History reviewed. No pertinent past surgical history. History reviewed. No pertinent family history. History  Substance Use Topics  . Smoking status: Passive Smoke Exposure - Never Smoker  . Smokeless tobacco: Not on file  . Alcohol Use: Yes     Comment: occ    Review of Systems  Gastrointestinal: Positive for diarrhea and blood in stool.  All other systems reviewed and are negative.     Allergies  Coconut flavor  Home Medications   Prior to Admission medications   Medication Sig Start Date End Date Taking? Authorizing Provider  ibuprofen (ADVIL,MOTRIN) 800 MG tablet Take 800 mg by mouth every 8 (eight) hours as needed for moderate pain.    Historical Provider, MD  traMADol (ULTRAM) 50 MG tablet Take 1 tablet (50 mg total) by mouth 4 (four) times daily. 03/15/14   Arman FilterGail K Schulz, NP  traMADol (ULTRAM) 50 MG tablet Take 1 tablet (50 mg total) by mouth every 6 (six) hours as needed. 04/15/14   Marissa Sciacca, PA-C   BP 128/73  Pulse 99  Temp(Src) 98.1 F (36.7 C) (Oral)  Resp 13  SpO2 100% Physical Exam   Constitutional: He is oriented to person, place, and time. He appears well-developed and well-nourished. No distress.  HENT:  Head: Normocephalic and atraumatic.  Right Ear: Hearing normal.  Left Ear: Hearing normal.  Nose: Nose normal.  Mouth/Throat: Oropharynx is clear and moist and mucous membranes are normal.  Eyes: Conjunctivae and EOM are normal. Pupils are equal, round, and reactive to light.  Neck: Normal range of motion. Neck supple.  Cardiovascular: Regular rhythm, S1 normal and S2 normal.  Exam reveals no gallop and no friction rub.   No murmur heard. Pulmonary/Chest: Effort normal and breath sounds normal. No respiratory distress. He exhibits no tenderness.  Abdominal: Soft. Normal appearance and bowel sounds are normal. There is no hepatosplenomegaly. There is no tenderness. There is no rebound, no guarding, no tenderness at McBurney's point and negative Murphy's sign. No hernia.  Musculoskeletal: Normal range of motion.  Neurological: He is alert and oriented to person, place, and time. He has normal strength. No cranial nerve deficit or sensory deficit. Coordination normal. GCS eye subscore is 4. GCS verbal subscore is 5. GCS motor subscore is 6.  Skin: Skin is warm, dry and intact. No rash noted. No cyanosis.  Psychiatric: He has a normal mood and affect. His speech is normal and behavior is normal. Thought content normal.    ED Course  Procedures (including critical care time) Labs Review Labs Reviewed  COMPREHENSIVE METABOLIC PANEL - Abnormal; Notable for the following:  GFR calc non Af Amer 81 (*)    All other components within normal limits  CBC WITH DIFFERENTIAL  URINALYSIS, ROUTINE W REFLEX MICROSCOPIC    Imaging Review No results found.   EKG Interpretation None      MDM   Final diagnoses:  None   diarrhea  Rectal bleeding  Patient presents to the ER for rectal bleeding. Patient reports diarrhea for the last 2 days. He has a positive sick  contact at home with a GI bug. Patient is not experiencing any abdominal pain. Abdominal exam is benign and nontender. She is afebrile. White blood cell count of 7.3. His hemoglobin is 15.4, no sign of significant anemia. Reviewing the records reveals that the patient has had a previous presentation for rectal bleeding 2 years ago. At that time he was referred to GI but did not followup. Patient does not appear to be in any distress, is appropriate for outpatient management. Symptomatic treatment for diarrhea, nausea, Anusol-HC suppository. Follow up with GI.   Gilda Creasehristopher J. Icelynn Onken, MD 05/21/14 1430

## 2014-05-21 NOTE — ED Notes (Signed)
Pt reports having recent stomach virus with n/v/d x 2 days. Reports now having dark red blood in his stools, hx of same in past but its intermittent and resolves itself. No acute distress noted at triage.
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
